# Patient Record
Sex: Female | Born: 1950 | Race: Black or African American | Hispanic: No | Smoking: Current every day smoker
Health system: Southern US, Community
[De-identification: ages and names within clinical notes are randomized; demographics above are authoritative.]

## PROBLEM LIST (undated history)

## (undated) DIAGNOSIS — Z973 Presence of spectacles and contact lenses: Secondary | ICD-10-CM

## (undated) HISTORY — PX: BREAST EXCISIONAL BIOPSY: SUR124

## (undated) HISTORY — PX: LASIK: SHX215

## (undated) HISTORY — PX: COLONOSCOPY: SHX174

---

## 1998-01-20 ENCOUNTER — Other Ambulatory Visit: Admission: RE | Admit: 1998-01-20 | Discharge: 1998-01-20 | Payer: Self-pay | Admitting: Obstetrics and Gynecology

## 1999-02-22 ENCOUNTER — Other Ambulatory Visit: Admission: RE | Admit: 1999-02-22 | Discharge: 1999-02-22 | Payer: Self-pay | Admitting: Obstetrics and Gynecology

## 2000-03-02 ENCOUNTER — Other Ambulatory Visit: Admission: RE | Admit: 2000-03-02 | Discharge: 2000-03-02 | Payer: Self-pay | Admitting: Obstetrics and Gynecology

## 2001-03-06 ENCOUNTER — Other Ambulatory Visit: Admission: RE | Admit: 2001-03-06 | Discharge: 2001-03-06 | Payer: Self-pay | Admitting: Obstetrics and Gynecology

## 2001-12-07 ENCOUNTER — Encounter: Admission: RE | Admit: 2001-12-07 | Discharge: 2001-12-07 | Payer: Self-pay | Admitting: *Deleted

## 2002-03-25 ENCOUNTER — Other Ambulatory Visit: Admission: RE | Admit: 2002-03-25 | Discharge: 2002-03-25 | Payer: Self-pay | Admitting: Obstetrics and Gynecology

## 2003-02-05 ENCOUNTER — Emergency Department (HOSPITAL_COMMUNITY): Admission: EM | Admit: 2003-02-05 | Discharge: 2003-02-05 | Payer: Self-pay | Admitting: Emergency Medicine

## 2003-07-23 ENCOUNTER — Other Ambulatory Visit: Admission: RE | Admit: 2003-07-23 | Discharge: 2003-07-23 | Payer: Self-pay | Admitting: Obstetrics and Gynecology

## 2003-08-04 ENCOUNTER — Encounter: Admission: RE | Admit: 2003-08-04 | Discharge: 2003-08-04 | Payer: Self-pay | Admitting: Obstetrics and Gynecology

## 2004-06-14 ENCOUNTER — Ambulatory Visit (HOSPITAL_COMMUNITY): Admission: RE | Admit: 2004-06-14 | Discharge: 2004-06-14 | Payer: Self-pay | Admitting: Gastroenterology

## 2004-07-28 ENCOUNTER — Other Ambulatory Visit: Admission: RE | Admit: 2004-07-28 | Discharge: 2004-07-28 | Payer: Self-pay | Admitting: Obstetrics and Gynecology

## 2005-08-20 ENCOUNTER — Emergency Department (HOSPITAL_COMMUNITY): Admission: EM | Admit: 2005-08-20 | Discharge: 2005-08-20 | Payer: Self-pay | Admitting: Emergency Medicine

## 2005-10-06 ENCOUNTER — Ambulatory Visit: Payer: Self-pay | Admitting: Gastroenterology

## 2005-10-13 ENCOUNTER — Ambulatory Visit: Payer: Self-pay | Admitting: Gastroenterology

## 2005-10-27 ENCOUNTER — Ambulatory Visit: Payer: Self-pay | Admitting: Gastroenterology

## 2005-11-03 ENCOUNTER — Ambulatory Visit: Payer: Self-pay | Admitting: Gastroenterology

## 2005-11-08 ENCOUNTER — Ambulatory Visit (HOSPITAL_COMMUNITY): Admission: RE | Admit: 2005-11-08 | Discharge: 2005-11-08 | Payer: Self-pay | Admitting: Gastroenterology

## 2005-11-10 ENCOUNTER — Ambulatory Visit: Payer: Self-pay | Admitting: Gastroenterology

## 2005-12-08 ENCOUNTER — Ambulatory Visit: Payer: Self-pay | Admitting: Gastroenterology

## 2005-12-15 ENCOUNTER — Ambulatory Visit: Payer: Self-pay | Admitting: Gastroenterology

## 2005-12-29 ENCOUNTER — Ambulatory Visit: Payer: Self-pay | Admitting: Gastroenterology

## 2006-01-12 ENCOUNTER — Ambulatory Visit: Payer: Self-pay | Admitting: Gastroenterology

## 2006-01-26 ENCOUNTER — Ambulatory Visit: Payer: Self-pay | Admitting: Gastroenterology

## 2006-02-23 ENCOUNTER — Ambulatory Visit: Payer: Self-pay | Admitting: Gastroenterology

## 2006-03-29 ENCOUNTER — Ambulatory Visit: Payer: Self-pay | Admitting: Gastroenterology

## 2006-06-13 ENCOUNTER — Ambulatory Visit: Payer: Self-pay | Admitting: Gastroenterology

## 2006-09-07 ENCOUNTER — Ambulatory Visit: Payer: Self-pay | Admitting: Gastroenterology

## 2009-04-15 ENCOUNTER — Other Ambulatory Visit: Admission: RE | Admit: 2009-04-15 | Discharge: 2009-04-15 | Payer: Self-pay | Admitting: Family Medicine

## 2009-04-22 ENCOUNTER — Encounter: Admission: RE | Admit: 2009-04-22 | Discharge: 2009-04-22 | Payer: Self-pay | Admitting: Family Medicine

## 2010-04-18 ENCOUNTER — Encounter: Payer: Self-pay | Admitting: Gastroenterology

## 2010-04-23 ENCOUNTER — Encounter
Admission: RE | Admit: 2010-04-23 | Discharge: 2010-04-23 | Payer: Self-pay | Source: Home / Self Care | Attending: Obstetrics and Gynecology | Admitting: Obstetrics and Gynecology

## 2010-05-04 ENCOUNTER — Other Ambulatory Visit: Payer: Self-pay | Admitting: Family Medicine

## 2010-05-04 ENCOUNTER — Other Ambulatory Visit (HOSPITAL_COMMUNITY)
Admission: RE | Admit: 2010-05-04 | Discharge: 2010-05-04 | Disposition: A | Discharge status: Home / Self Care | Payer: BC Managed Care – PPO | Source: Ambulatory Visit | Attending: Family Medicine | Admitting: Family Medicine

## 2010-05-04 DIAGNOSIS — Z1159 Encounter for screening for other viral diseases: Secondary | ICD-10-CM | POA: Insufficient documentation

## 2010-05-04 DIAGNOSIS — Z113 Encounter for screening for infections with a predominantly sexual mode of transmission: Secondary | ICD-10-CM | POA: Insufficient documentation

## 2010-05-04 DIAGNOSIS — Z124 Encounter for screening for malignant neoplasm of cervix: Secondary | ICD-10-CM | POA: Insufficient documentation

## 2010-08-13 NOTE — Op Note (Signed)
[name], Christie Velasquez NO.:  [id number]   MEDICAL RECORD NO.:  [id number]          PATIENT TYPE:  AMB   LOCATION:  ENDO                         FACILITY:  [facility]   PHYSICIAN:  Graylin Shiver, M.D.   DATE OF BIRTH:  Oct 01, 1950   DATE OF PROCEDURE:  06/14/2004  DATE OF DISCHARGE:                                 OPERATIVE REPORT   PROCEDURE PERFORMED:  Colonoscopy.   INDICATIONS:  Screening.   Informed consent was obtained after explanation of the risks of bleeding,  infection and perforation.   PREMEDICATION:  Fentanyl 100 mcg IV, Versed 10 milligrams IV.   PROCEDURE:  With the patient in the left lateral decubitus position, a  rectal exam was performed. No masses were felt. The Olympus colonoscope was  inserted into the rectum and advanced around the colon to the cecum. Cecal  landmarks were identified. The cecum and ascending colon were normal. The  transverse colon normal. The descending colon, sigmoid and rectum were  normal. She tolerated the procedure well without complications.   IMPRESSION:  Normal colonoscopy to the cecum.   I would recommend a follow-up screening colonoscopy again in 10 years.      SFG/MEDQ  D:  06/14/2004  T:  06/14/2004  Job:  [id number]   cc:   Juluis Mire, M.D.  [address] [place]  [place] [address]  Fax: [phone]

## 2011-03-30 ENCOUNTER — Other Ambulatory Visit: Payer: Self-pay | Admitting: Obstetrics and Gynecology

## 2011-03-30 DIAGNOSIS — Z1231 Encounter for screening mammogram for malignant neoplasm of breast: Secondary | ICD-10-CM

## 2011-04-25 ENCOUNTER — Ambulatory Visit
Admission: RE | Admit: 2011-04-25 | Discharge: 2011-04-25 | Disposition: A | Discharge status: Home / Self Care | Payer: BC Managed Care – PPO | Source: Ambulatory Visit | Attending: Obstetrics and Gynecology | Admitting: Obstetrics and Gynecology

## 2011-04-25 DIAGNOSIS — Z1231 Encounter for screening mammogram for malignant neoplasm of breast: Secondary | ICD-10-CM

## 2012-03-23 ENCOUNTER — Other Ambulatory Visit: Payer: Self-pay | Admitting: Obstetrics and Gynecology

## 2012-03-23 DIAGNOSIS — Z1231 Encounter for screening mammogram for malignant neoplasm of breast: Secondary | ICD-10-CM

## 2012-04-25 ENCOUNTER — Ambulatory Visit
Admission: RE | Admit: 2012-04-25 | Discharge: 2012-04-25 | Disposition: A | Discharge status: Home / Self Care | Payer: BC Managed Care – PPO | Source: Ambulatory Visit | Attending: Obstetrics and Gynecology | Admitting: Obstetrics and Gynecology

## 2012-04-25 DIAGNOSIS — Z1231 Encounter for screening mammogram for malignant neoplasm of breast: Secondary | ICD-10-CM

## 2013-04-01 ENCOUNTER — Other Ambulatory Visit: Payer: Self-pay

## 2013-04-01 DIAGNOSIS — Z1231 Encounter for screening mammogram for malignant neoplasm of breast: Secondary | ICD-10-CM

## 2013-05-13 ENCOUNTER — Ambulatory Visit: Payer: BC Managed Care – PPO

## 2013-05-13 ENCOUNTER — Ambulatory Visit
Admission: RE | Admit: 2013-05-13 | Discharge: 2013-05-13 | Disposition: A | Discharge status: Home / Self Care | Payer: BC Managed Care – PPO | Source: Ambulatory Visit

## 2013-05-13 DIAGNOSIS — Z1231 Encounter for screening mammogram for malignant neoplasm of breast: Secondary | ICD-10-CM

## 2013-05-27 ENCOUNTER — Other Ambulatory Visit: Payer: Self-pay | Admitting: Family Medicine

## 2013-05-27 ENCOUNTER — Other Ambulatory Visit (HOSPITAL_COMMUNITY)
Admission: RE | Admit: 2013-05-27 | Discharge: 2013-05-27 | Disposition: A | Discharge status: Home / Self Care | Payer: BC Managed Care – PPO | Source: Ambulatory Visit | Attending: Family Medicine | Admitting: Family Medicine

## 2013-05-27 DIAGNOSIS — Z124 Encounter for screening for malignant neoplasm of cervix: Secondary | ICD-10-CM | POA: Insufficient documentation

## 2013-07-16 ENCOUNTER — Other Ambulatory Visit: Payer: Self-pay | Admitting: Orthopedic Surgery

## 2013-07-25 ENCOUNTER — Encounter (HOSPITAL_BASED_OUTPATIENT_CLINIC_OR_DEPARTMENT_OTHER): Payer: Self-pay | Admitting: *Deleted

## 2013-07-25 NOTE — Progress Notes (Signed)
No labs needed

## 2013-07-29 NOTE — H&P (Signed)
  Christie Velasquez is an 63 y.o. female.   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 She previously worked at [employer]. She has a history of stenosing tenosynovitis of her right first dorsal compartment dating back 6 or more months. She has a grandchild that she cannot lift due to pain. She reports discomfort with radial and ulnar deviation of the wrist. She could not recall any antecedent history of injury.     Past Medical History  Diagnosis Date  . Wears glasses     Past Surgical History  Procedure Laterality Date  . Colonoscopy    . Lasik      History reviewed. No pertinent family history. Social History:  reports that she has been smoking.  She does not have any smokeless tobacco history on file. She reports that she drinks alcohol. She reports that she does not use illicit drugs.  Allergies:  Allergies  Allergen Reactions  . Sulfa Antibiotics Hives    No prescriptions prior to admission    No results found for this or any previous visit (from the past 48 hour(s)).  No results found.   Pertinent items are noted in HPI.  Height 5\' 3"  (1.6 m), weight 64.411 kg (142 lb).  General appearance: alert Head: Normocephalic, without obvious abnormality Neck: supple, symmetrical, trachea midline Resp: clear to auscultation bilaterally Cardio: regular rate and rhythm GI: normal findings: bowel sounds normal Extremities:  Inspection of her hands reveals an obvious swelling of her right first dorsal compartment. Her pulses and capillary refill are intact bilaterally. She has full ROM of her fingers and thumb. She has a painful Finkelstein's maneuver on the right. She cannot radially deviate her wrist beyond 15 degrees due to pain and cannot ulnarly deviate more than 20 degrees due to pain. She has no sign of entrapment neuropathy of her median,  radial or ulnar nerves.   X-rays of her wrist AP, lateral and oblique demonstrates normal bony anatomy.   Pulses: 2+ and symmetric Skin: normal Neurologic: Grossly normal    Assessment/Plan Impression: Right wrist DeQuervain's STS  Plan: To the OR for release right first DC.The procedure, risks,benefits and post-op course were discussed with the patient at length and they were in agreement with the plan.   [name] 07/29/2013, 2:42 PM   H&P documentation: 07/30/2013  -History and Physical Reviewed  -Patient has been re-examined  -No change in the plan of care  [name], MD

## 2013-07-30 ENCOUNTER — Encounter (HOSPITAL_BASED_OUTPATIENT_CLINIC_OR_DEPARTMENT_OTHER)
Admission: RE | Disposition: A | Discharge status: Home / Self Care | Payer: Self-pay | Source: Ambulatory Visit | Attending: Orthopedic Surgery

## 2013-07-30 ENCOUNTER — Ambulatory Visit (HOSPITAL_BASED_OUTPATIENT_CLINIC_OR_DEPARTMENT_OTHER): Payer: BC Managed Care – PPO | Admitting: Anesthesiology

## 2013-07-30 ENCOUNTER — Encounter (HOSPITAL_BASED_OUTPATIENT_CLINIC_OR_DEPARTMENT_OTHER): Payer: BC Managed Care – PPO | Admitting: Anesthesiology

## 2013-07-30 ENCOUNTER — Ambulatory Visit (HOSPITAL_BASED_OUTPATIENT_CLINIC_OR_DEPARTMENT_OTHER)
Admission: RE | Admit: 2013-07-30 | Discharge: 2013-07-30 | Disposition: A | Discharge status: Home / Self Care | Payer: BC Managed Care – PPO | Source: Ambulatory Visit | Attending: Orthopedic Surgery | Admitting: Orthopedic Surgery

## 2013-07-30 ENCOUNTER — Encounter (HOSPITAL_BASED_OUTPATIENT_CLINIC_OR_DEPARTMENT_OTHER): Payer: Self-pay | Admitting: Orthopedic Surgery

## 2013-07-30 DIAGNOSIS — Z882 Allergy status to sulfonamides status: Secondary | ICD-10-CM | POA: Insufficient documentation

## 2013-07-30 DIAGNOSIS — M654 Radial styloid tenosynovitis [de Quervain]: Secondary | ICD-10-CM | POA: Insufficient documentation

## 2013-07-30 HISTORY — PX: DORSAL COMPARTMENT RELEASE: SHX5039

## 2013-07-30 HISTORY — DX: Presence of spectacles and contact lenses: Z97.3

## 2013-07-30 LAB — POCT HEMOGLOBIN-HEMACUE: Hemoglobin: 14.2 g/dL (ref 12.0–15.0)

## 2013-07-30 SURGERY — RELEASE, FIRST DORSAL COMPARTMENT, HAND
Anesthesia: Monitor Anesthesia Care | Site: Wrist | Laterality: Right

## 2013-07-30 MED ORDER — ONDANSETRON HCL 4 MG/2ML IJ SOLN: 4.0000 mg | Freq: Once | INTRAMUSCULAR | Status: DC | PRN

## 2013-07-30 MED ORDER — OXYCODONE HCL 5 MG/5ML PO SOLN: 5.0000 mg | Freq: Once | ORAL | Status: AC | PRN

## 2013-07-30 MED ORDER — FENTANYL CITRATE 0.05 MG/ML IJ SOLN
INTRAMUSCULAR | Status: DC | PRN
  Administered 2013-07-30 (×2): 50 ug via INTRAVENOUS

## 2013-07-30 MED ORDER — ACETAMINOPHEN-CODEINE #3 300-30 MG PO TABS
1.0000 | ORAL_TABLET | ORAL | Status: AC | PRN
Start: 2013-07-30 — End: ?

## 2013-07-30 MED ORDER — FENTANYL CITRATE 0.05 MG/ML IJ SOLN
50.0000 ug | INTRAMUSCULAR | Status: DC | PRN
Start: 2013-07-30 — End: 2013-07-30

## 2013-07-30 MED ORDER — HYDROMORPHONE HCL PF 1 MG/ML IJ SOLN: 0.2500 mg | INTRAMUSCULAR | Status: DC | PRN

## 2013-07-30 MED ORDER — LACTATED RINGERS IV SOLN
INTRAVENOUS | Status: DC
  Administered 2013-07-30: 07:00:00 via INTRAVENOUS

## 2013-07-30 MED ORDER — FENTANYL CITRATE 0.05 MG/ML IJ SOLN
INTRAMUSCULAR | Status: AC
  Filled 2013-07-30: qty 4

## 2013-07-30 MED ORDER — OXYCODONE HCL 5 MG PO TABS
5.0000 mg | ORAL_TABLET | Freq: Once | ORAL | Status: AC | PRN
  Administered 2013-07-30: 5 mg via ORAL

## 2013-07-30 MED ORDER — MIDAZOLAM HCL 5 MG/5ML IJ SOLN
INTRAMUSCULAR | Status: DC | PRN
  Administered 2013-07-30: 2 mg via INTRAVENOUS

## 2013-07-30 MED ORDER — OXYCODONE HCL 5 MG PO TABS
ORAL_TABLET | ORAL | Status: AC
  Filled 2013-07-30: qty 1

## 2013-07-30 MED ORDER — MIDAZOLAM HCL 2 MG/2ML IJ SOLN: 1.0000 mg | INTRAMUSCULAR | Status: DC | PRN

## 2013-07-30 MED ORDER — LIDOCAINE HCL 2 % IJ SOLN
INTRAMUSCULAR | Status: DC | PRN
  Administered 2013-07-30: 2 mL

## 2013-07-30 MED ORDER — CHLORHEXIDINE GLUCONATE 4 % EX LIQD: 60.0000 mL | Freq: Once | CUTANEOUS | Status: DC

## 2013-07-30 MED ORDER — LIDOCAINE HCL 2 % IJ SOLN
INTRAMUSCULAR | Status: AC
  Filled 2013-07-30: qty 20

## 2013-07-30 MED ORDER — PROPOFOL 10 MG/ML IV BOLUS
INTRAVENOUS | Status: DC | PRN
  Administered 2013-07-30: 25 mg via INTRAVENOUS
  Administered 2013-07-30: 50 mg via INTRAVENOUS

## 2013-07-30 MED ORDER — METHYLPREDNISOLONE ACETATE 40 MG/ML IJ SUSP
INTRAMUSCULAR | Status: AC
  Filled 2013-07-30: qty 1

## 2013-07-30 MED ORDER — ONDANSETRON HCL 4 MG/2ML IJ SOLN
INTRAMUSCULAR | Status: DC | PRN
  Administered 2013-07-30: 4 mg via INTRAVENOUS

## 2013-07-30 MED ORDER — MIDAZOLAM HCL 2 MG/2ML IJ SOLN
INTRAMUSCULAR | Status: AC
  Filled 2013-07-30: qty 2

## 2013-07-30 SURGICAL SUPPLY — 46 items
BANDAGE COBAN STERILE 2 (GAUZE/BANDAGES/DRESSINGS) ×3 IMPLANT
BANDAGE ELASTIC 3 VELCRO ST LF (GAUZE/BANDAGES/DRESSINGS) ×3 IMPLANT
BLADE 15 SAFETY STRL DISP (BLADE) IMPLANT
BLADE MINI RND TIP GREEN BEAV (BLADE) IMPLANT
BLADE SURG 15 STRL LF DISP TIS (BLADE) ×1 IMPLANT
BLADE SURG 15 STRL SS (BLADE) ×2
BNDG ESMARK 4X9 LF (GAUZE/BANDAGES/DRESSINGS) ×3 IMPLANT
BRUSH SCRUB EZ PLAIN DRY (MISCELLANEOUS) ×3 IMPLANT
CLOSURE WOUND 1/2 X4 (GAUZE/BANDAGES/DRESSINGS) ×1
CORDS BIPOLAR (ELECTRODE) ×3 IMPLANT
COVER MAYO STAND STRL (DRAPES) ×3 IMPLANT
COVER TABLE BACK 60X90 (DRAPES) ×3 IMPLANT
CUFF TOURNIQUET SINGLE 18IN (TOURNIQUET CUFF) ×3 IMPLANT
DECANTER SPIKE VIAL GLASS SM (MISCELLANEOUS) IMPLANT
DRAPE EXTREMITY T 121X128X90 (DRAPE) ×3 IMPLANT
DRAPE SURG 17X23 STRL (DRAPES) ×3 IMPLANT
DRSG TEGADERM 4X4.75 (GAUZE/BANDAGES/DRESSINGS) ×3 IMPLANT
GAUZE SPONGE 4X4 12PLY STRL (GAUZE/BANDAGES/DRESSINGS) ×3 IMPLANT
GLOVE BIO SURGEON STRL SZ 6.5 (GLOVE) ×2 IMPLANT
GLOVE BIO SURGEONS STRL SZ 6.5 (GLOVE) ×1
GLOVE BIOGEL M STRL SZ7.5 (GLOVE) ×3 IMPLANT
GLOVE BIOGEL PI IND STRL 7.0 (GLOVE) ×1 IMPLANT
GLOVE BIOGEL PI INDICATOR 7.0 (GLOVE) ×2
GLOVE EXAM NITRILE MD LF STRL (GLOVE) ×3 IMPLANT
GLOVE ORTHO TXT STRL SZ7.5 (GLOVE) ×3 IMPLANT
GOWN STRL REUS W/ TWL LRG LVL3 (GOWN DISPOSABLE) ×1 IMPLANT
GOWN STRL REUS W/ TWL XL LVL3 (GOWN DISPOSABLE) ×1 IMPLANT
GOWN STRL REUS W/TWL LRG LVL3 (GOWN DISPOSABLE) ×2
GOWN STRL REUS W/TWL XL LVL3 (GOWN DISPOSABLE) ×2
NEEDLE 27GAX1X1/2 (NEEDLE) ×3 IMPLANT
PACK BASIN DAY SURGERY FS (CUSTOM PROCEDURE TRAY) ×3 IMPLANT
PAD CAST 3X4 CTTN HI CHSV (CAST SUPPLIES) IMPLANT
PADDING CAST ABS 4INX4YD NS (CAST SUPPLIES) ×2
PADDING CAST ABS COTTON 4X4 ST (CAST SUPPLIES) ×1 IMPLANT
PADDING CAST COTTON 3X4 STRL (CAST SUPPLIES)
SLEEVE SCD COMPRESS KNEE MED (MISCELLANEOUS) IMPLANT
STOCKINETTE 4X48 STRL (DRAPES) ×3 IMPLANT
STRIP CLOSURE SKIN 1/2X4 (GAUZE/BANDAGES/DRESSINGS) ×2 IMPLANT
SUT PROLENE 3 0 PS 2 (SUTURE) ×3 IMPLANT
SUT PROLENE 4 0 P 3 18 (SUTURE) IMPLANT
SUT VIC AB 4-0 P-3 18XBRD (SUTURE) IMPLANT
SUT VIC AB 4-0 P3 18 (SUTURE)
SYR 3ML 23GX1 SAFETY (SYRINGE) IMPLANT
SYR CONTROL 10ML LL (SYRINGE) ×3 IMPLANT
TRAY DSU PREP LF (CUSTOM PROCEDURE TRAY) ×3 IMPLANT
UNDERPAD 30X30 INCONTINENT (UNDERPADS AND DIAPERS) ×3 IMPLANT

## 2013-07-30 NOTE — Transfer of Care (Signed)
Immediate Anesthesia Transfer of Care Note  Patient: Christie Velasquez  Procedure(s) Performed: Procedure(s): RELEASE RIGHT FIRST  DORSAL COMPARTMENT (DEQUERVAIN) (Right)  Patient Location: PACU  Anesthesia Type:MAC  Level of Consciousness: awake, alert , oriented and patient cooperative  Airway & Oxygen Therapy: Patient Spontanous Breathing and Patient connected to face mask oxygen  Post-op Assessment: Report given to PACU RN and Post -op Vital signs reviewed and stable  Post vital signs: Reviewed and stable  Complications: No apparent anesthesia complications

## 2013-07-30 NOTE — Discharge Instructions (Addendum)

## 2013-07-30 NOTE — Op Note (Signed)
[id number] 

## 2013-07-30 NOTE — Op Note (Signed)
NAMETIONNE, CARELLI NO.:  [id number]  MEDICAL RECORD NO.:  [id number]  LOCATION:                                 FACILITY:  PHYSICIAN:  Youlanda Mighty. Alastair Hennes, M.D. 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  OPERATION:  Release of right first dorsal compartment.  Resection of septum between abductor pollicis longus and extensor pollicis brevis tendons.  OPERATING SURGEON:  Youlanda Mighty. Shiann Kam, MD  ASSISTANT:  Registered nurse.  ANESTHESIA:  A 2% lidocaine field block and first dorsal compartment block supplemented by IV sedation.  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  She had a swelling at the first dorsal compartment, pain with ulnar deviation of her wrist, and a very painful Finkelstein maneuver.  She has failed splinting, activity modification, and anti-inflammatory medication.  Due to the significant wall thickness increase, she had been advised proceeding with release of the first dorsal compartment under local anesthesia and sedation.  After informed consent, she is brought to the operating room at this time.  Preoperatively, she was interviewed by Dr. Linna Caprice from Anesthesia. Monitored anesthesia care was recommended and accepted by Ms. Belenda Cruise.  Preoperatively, she was examined in the holding area.  A proper surgical site identified as protocol with marking pen.  PROCEDURE:  Keeya Dyckman is brought to room 2 of the [facility] and placed in supine position on the operating table.  Following IV sedation under Dr. Verneda Skill direct supervision, the right hand and arm were prepped with Betadine followed by infiltration of 2% lidocaine into the path of the intended incision and around the first dorsal compartment and into the first dorsal compartment of the right wrist.  After 5 minutes, excellent anesthesia was achieved.  The right hand and arm were prepped with Betadine soap and solution and sterilely draped. A pneumatic tourniquet was applied to the proximal right brachium.  Following exsanguination of the right arm with Esmarch bandage, arterial tourniquet was inflated to 220 mmHg.  Following routine surgical time-out, a 1.5-cm incision was fashioned transversely directly over the thickened compartment.  Subcutaneous tissues were notable for significant edema.  These were gently released in the first dorsal compartment with scissors dissection and use of a Freer.  Ragnell retractors were placed followed by incision of the compartment at its dorsal apex.  There were 2 slips of the abductor pollicis longus and a single slip of the extensor pollicis brevis. Septum was noted separating the extensor pollicis brevis dorsally.  This was resected with scissors and rongeur.  Thereafter, free range of motion of the thumb and wrist was recovered.  The wound was repaired with intradermal 3-0 Prolene suture and Steri-Strips.  Ms. Madruga was placed in compressive dressing with sterile  gauze, Tegaderm and Coban.  There were no operative complications.  For aftercare, she was provided a prescription for Tylenol with Codeine #3, 1 or 2 tablets p.o. q.4-6 hours p.r.n. pain, 20 tabs without refill.     Youlanda Mighty Autry Droege, M.D.     RVS/MEDQ  D:  07/30/2013  T:  07/30/2013  Job:  [id number]

## 2013-07-30 NOTE — Brief Op Note (Signed)
07/30/2013  8:02 AM  PATIENT:  Christie Velasquez  63 y.o. female  PRE-OPERATIVE DIAGNOSIS:  SEVERE RIGHT DEQUERVAIN'S 1ST DORSAL COMPARTMENT  POST-OPERATIVE DIAGNOSIS:  SEVERE RIGHT DEQUERVAIN'S 1ST DORSAL COMPARTMENT  PROCEDURE:  Procedure(s): RELEASE RIGHT FIRST  DORSAL COMPARTMENT (DEQUERVAIN) (Right)  SURGEON:  Surgeon(s) and Role:    * [name]., MD - Primary  PHYSICIAN ASSISTANT:   ASSISTANTS: nurse  ANESTHESIA:   MAC  EBL:  Total I/O In: 500 [I.V.:500] Out: -   BLOOD ADMINISTERED:none  DRAINS: none   LOCAL MEDICATIONS USED:  XYLOCAINE   SPECIMEN:  No Specimen  DISPOSITION OF SPECIMEN:  N/A  COUNTS:  YES  TOURNIQUET:   Total Tourniquet Time Documented: Upper Arm (Right) - 10 minutes Total: Upper Arm (Right) - 10 minutes   DICTATION: .Other Dictation: Dictation Number [id number]  PLAN OF CARE: Discharge to home after PACU  PATIENT DISPOSITION:  PACU - hemodynamically stable.   Delay start of Pharmacological VTE agent (>24hrs) due to surgical blood loss or risk of bleeding: not applicable

## 2013-07-30 NOTE — Anesthesia Postprocedure Evaluation (Signed)
  Anesthesia Post-op Note  Patient: Christie Velasquez  Procedure(s) Performed: Procedure(s): RELEASE RIGHT FIRST  DORSAL COMPARTMENT (DEQUERVAIN) (Right)  Patient Location: PACU  Anesthesia Type:MAC  Level of Consciousness: awake, alert  and oriented  Airway and Oxygen Therapy: Patient Spontanous Breathing  Post-op Pain: mild  Post-op Assessment: Post-op Vital signs reviewed, Patient's Cardiovascular Status Stable, Respiratory Function Stable, Patent Airway and Pain level controlled  Post-op Vital Signs: stable  Last Vitals:  Filed Vitals:   07/30/13 0843  BP: 140/68  Pulse: 62  Temp: 36.4 C  Resp: 18    Complications: No apparent anesthesia complications

## 2013-07-30 NOTE — Anesthesia Preprocedure Evaluation (Signed)
Anesthesia Evaluation  Patient identified by MRN, date of birth, ID band Patient awake    Reviewed: Allergy & Precautions, H&P , NPO status   Airway Mallampati: II TM Distance: >3 FB Neck ROM: Full    Dental  (+) Teeth Intact   Pulmonary Current Smoker,  breath sounds clear to auscultation        Cardiovascular Rhythm:Regular Rate:Normal     Neuro/Psych    GI/Hepatic   Endo/Other    Renal/GU      Musculoskeletal   Abdominal   Peds  Hematology   Anesthesia Other Findings   Reproductive/Obstetrics                           Anesthesia Physical Anesthesia Plan  ASA: II  Anesthesia Plan: MAC   Post-op Pain Management:    Induction: Intravenous  Airway Management Planned: Simple Face Mask and Natural Airway  Additional Equipment:   Intra-op Plan:   Post-operative Plan:   Informed Consent: I have reviewed the patients History and Physical, chart, labs and discussed the procedure including the risks, benefits and alternatives for the proposed anesthesia with the patient or authorized representative who has indicated his/her understanding and acceptance.     Plan Discussed with: CRNA and Anesthesiologist  Anesthesia Plan Comments: (Smoker  Plan MAC per patient request  Roberts Gaudy)        Anesthesia Quick Evaluation

## 2013-07-31 ENCOUNTER — Encounter (HOSPITAL_BASED_OUTPATIENT_CLINIC_OR_DEPARTMENT_OTHER): Payer: Self-pay | Admitting: Orthopedic Surgery

## 2013-07-31 NOTE — Addendum Note (Signed)
Addendum created 07/31/13 0849 by Tawni Millers, CRNA   Modules edited: Charges VN

## 2013-08-28 ENCOUNTER — Other Ambulatory Visit: Payer: Self-pay | Admitting: Nurse Practitioner

## 2013-08-28 DIAGNOSIS — C220 Liver cell carcinoma: Secondary | ICD-10-CM

## 2013-09-26 ENCOUNTER — Ambulatory Visit
Admission: RE | Admit: 2013-09-26 | Discharge: 2013-09-26 | Disposition: A | Discharge status: Home / Self Care | Payer: BC Managed Care – PPO | Source: Ambulatory Visit | Attending: Nurse Practitioner | Admitting: Nurse Practitioner

## 2013-09-26 DIAGNOSIS — C220 Liver cell carcinoma: Secondary | ICD-10-CM

## 2014-04-24 ENCOUNTER — Other Ambulatory Visit: Payer: Self-pay

## 2014-04-24 DIAGNOSIS — Z1231 Encounter for screening mammogram for malignant neoplasm of breast: Secondary | ICD-10-CM

## 2014-05-15 ENCOUNTER — Ambulatory Visit: Payer: BC Managed Care – PPO

## 2014-08-05 ENCOUNTER — Ambulatory Visit
Admission: RE | Admit: 2014-08-05 | Discharge: 2014-08-05 | Disposition: A | Discharge status: Home / Self Care | Payer: BC Managed Care – PPO | Source: Ambulatory Visit

## 2014-08-05 DIAGNOSIS — Z1231 Encounter for screening mammogram for malignant neoplasm of breast: Secondary | ICD-10-CM

## 2015-07-01 ENCOUNTER — Other Ambulatory Visit: Payer: Self-pay

## 2015-07-01 DIAGNOSIS — Z1231 Encounter for screening mammogram for malignant neoplasm of breast: Secondary | ICD-10-CM

## 2015-08-10 ENCOUNTER — Ambulatory Visit
Admission: RE | Admit: 2015-08-10 | Discharge: 2015-08-10 | Disposition: A | Discharge status: Home / Self Care | Payer: BC Managed Care – PPO | Source: Ambulatory Visit

## 2015-08-10 DIAGNOSIS — Z1231 Encounter for screening mammogram for malignant neoplasm of breast: Secondary | ICD-10-CM

## 2016-07-05 ENCOUNTER — Other Ambulatory Visit: Payer: Self-pay | Admitting: Cardiovascular Disease

## 2016-07-05 ENCOUNTER — Other Ambulatory Visit: Payer: Self-pay | Admitting: Family Medicine

## 2016-07-05 DIAGNOSIS — Z1231 Encounter for screening mammogram for malignant neoplasm of breast: Secondary | ICD-10-CM

## 2016-07-19 ENCOUNTER — Other Ambulatory Visit: Payer: Self-pay | Admitting: Family Medicine

## 2016-07-19 ENCOUNTER — Other Ambulatory Visit (HOSPITAL_COMMUNITY)
Admission: RE | Admit: 2016-07-19 | Discharge: 2016-07-19 | Disposition: A | Discharge status: Home / Self Care | Payer: BC Managed Care – PPO | Source: Ambulatory Visit | Attending: Family Medicine | Admitting: Family Medicine

## 2016-07-19 DIAGNOSIS — Z124 Encounter for screening for malignant neoplasm of cervix: Secondary | ICD-10-CM | POA: Diagnosis present

## 2016-07-21 LAB — CYTOLOGY - PAP
Chlamydia: NEGATIVE
Diagnosis: NEGATIVE
Neisseria Gonorrhea: NEGATIVE

## 2016-08-15 ENCOUNTER — Ambulatory Visit
Admission: RE | Admit: 2016-08-15 | Discharge: 2016-08-15 | Disposition: A | Discharge status: Home / Self Care | Payer: BC Managed Care – PPO | Source: Ambulatory Visit | Attending: Cardiovascular Disease | Admitting: Cardiovascular Disease

## 2016-08-15 DIAGNOSIS — Z1231 Encounter for screening mammogram for malignant neoplasm of breast: Secondary | ICD-10-CM

## 2017-01-22 ENCOUNTER — Encounter (HOSPITAL_COMMUNITY): Payer: Self-pay | Admitting: *Deleted

## 2017-01-22 ENCOUNTER — Emergency Department (HOSPITAL_COMMUNITY): Payer: BC Managed Care – PPO

## 2017-01-22 ENCOUNTER — Emergency Department (HOSPITAL_COMMUNITY)
Admission: EM | Admit: 2017-01-22 | Discharge: 2017-01-22 | Disposition: A | Discharge status: Home / Self Care | Payer: BC Managed Care – PPO | Attending: Emergency Medicine | Admitting: Emergency Medicine

## 2017-01-22 DIAGNOSIS — F17200 Nicotine dependence, unspecified, uncomplicated: Secondary | ICD-10-CM | POA: Diagnosis not present

## 2017-01-22 DIAGNOSIS — M25511 Pain in right shoulder: Secondary | ICD-10-CM | POA: Insufficient documentation

## 2017-01-22 MED ORDER — CYCLOBENZAPRINE HCL 10 MG PO TABS: 10.0000 mg | ORAL_TABLET | Freq: Three times a day (TID) | ORAL | 0 refills | Status: AC | PRN

## 2017-01-22 MED ORDER — LIDOCAINE 5 % EX PTCH
1.0000 | MEDICATED_PATCH | CUTANEOUS | Status: DC
Start: 2017-01-22 — End: 2017-01-22
  Administered 2017-01-22: 1 via TRANSDERMAL
  Filled 2017-01-22: qty 1

## 2017-01-22 MED ORDER — NAPROXEN 375 MG PO TABS: 375.0000 mg | ORAL_TABLET | Freq: Two times a day (BID) | ORAL | 0 refills | Status: AC

## 2017-01-22 MED ORDER — NAPROXEN 250 MG PO TABS
250.0000 mg | ORAL_TABLET | Freq: Once | ORAL | Status: AC
  Administered 2017-01-22: 250 mg via ORAL
  Filled 2017-01-22: qty 1

## 2017-01-22 MED ORDER — LIDOCAINE 5 % EX PTCH: 1.0000 | MEDICATED_PATCH | CUTANEOUS | 0 refills | Status: AC

## 2017-01-22 NOTE — ED Provider Notes (Signed)
[place] EMERGENCY DEPARTMENT Provider Note   CSN: [id number] Arrival date & time: 01/22/17  5784     History   Chief Complaint Chief Complaint  Patient presents with  . Shoulder Pain    HPI Christie Velasquez is a 66 y.o. female.  HPI 66 year old African-American female with no pertinent past medical history presents to the emergency department today with complaints of right shoulder pain.  Patient states that she has been having right shoulder pain since 5 days ago.  The patient states that the pain is worse with range of motion.  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  Denies any significant trauma to the shoulder.  Patient states that she has limited range of motion to the shoulder due to the pain.  It is difficult to raise her right shoulder over her head.  Also states that it is difficult to lift up her arm due to the pain in her shoulder.  States that it is point tenderness to palpation.  Patient has been taking Tylenol without relief.   she is also been trying icy hot over-the-counter with little relief.  Patient denies any associated chest pain, shortness of breath, palpitations, fatigue, weakness.  Denies any associated paresthesias or weakness. Past Medical History:  Diagnosis Date  . Wears glasses     There are no active problems to display for this patient.   Past Surgical History:  Procedure Laterality Date  . BREAST EXCISIONAL BIOPSY    . CESAREAN SECTION    . COLONOSCOPY    . DORSAL COMPARTMENT RELEASE Right 07/30/2013   Procedure: RELEASE RIGHT FIRST  DORSAL COMPARTMENT (DEQUERVAIN);  Surgeon: [name]., MD;  [facility];  Service: Orthopedics;  Laterality: Right;  . LASIK      OB History    No data available       Home Medications    Prior to Admission medications   Medication Sig Start Date End Date Taking? Authorizing Provider    acetaminophen-codeine (TYLENOL #3) 300-30 MG per tablet Take 1-2 tablets by mouth every 4 (four) hours as needed for moderate pain. 07/30/13   Sypher, Herbie Baltimore, MD  Multiple Vitamins-Minerals (MULTIVITAMIN WITH MINERALS) tablet Take 1 tablet by mouth daily.    [provider]    Family History Family History  Problem Relation Age of Onset  . Breast cancer Sister     Social History Social History  Substance Use Topics  . Smoking status: Current Every Day Smoker    Packs/day: 0.50  . Smokeless tobacco: Never Used  . Alcohol use Yes     Comment: occ     Allergies   Sulfa antibiotics   Review of Systems Review of Systems  Constitutional: Negative for fatigue.  Respiratory: Negative for shortness of breath.   Cardiovascular: Negative for chest pain.  Musculoskeletal: Positive for arthralgias and myalgias. Negative for neck pain and neck stiffness.  Skin: Negative for color change.  Neurological: Negative for weakness and numbness.     Physical Exam Updated Vital Signs BP (!) 151/81 (BP Location: Left Arm)   Pulse 69   Temp 98.1 F (36.7 C) (Oral)   Resp 14   Ht 5\' 3"  (1.6 m)   Wt 67.1 kg (148 lb)   SpO2 100%   BMI 26.22 kg/m   Physical Exam  Constitutional: She appears well-developed and well-nourished. No distress.  HENT:  Head:  Normocephalic and atraumatic.  Eyes: Right eye exhibits no discharge. Left eye exhibits no discharge. No scleral icterus.  Neck: Normal range of motion.  Pulmonary/Chest: No respiratory distress.  Musculoskeletal: Normal range of motion.       Arms: Point tenderness over the head of the right humerus.  Patient with limited range of motion due to the pain.  Patient unable to fully raise her right arm over her head.  Positive Hawkins and Neer sign.  Grip strength is normal.  Patient has tense musculature noted of the right paraspinal musculature that radiate to the right trapezius.  No obvious deformity, edema, ecchymosis, erythema,  warmth.  Radial pulses 2+ bilaterally.  Sensation intact.  Cap refill normal.  Axillary nerve intact.  Neurological: She is alert.  Skin: Skin is warm and dry. Capillary refill takes less than 2 seconds. No pallor.  Psychiatric: Her behavior is normal. Judgment and thought content normal.  Nursing note and vitals reviewed.    ED Treatments / Results  Labs (all labs ordered are listed, but only abnormal results are displayed) Labs Reviewed - No data to display  EKG  EKG Interpretation None       Radiology Dg Shoulder Right  Result Date: 01/22/2017 CLINICAL DATA:  Pt c/o right shoulder pain with movement x 5 days. No injury to the area. No hx of prior injuries or surgeries to the area. EXAM: RIGHT SHOULDER - 2+ VIEW COMPARISON:  None. FINDINGS: Osseous alignment is normal. No fracture line or displaced fracture fragment seen. No acute or suspicious osseous lesion. Calcifications are seen at the superior-lateral margin of the right humeral head suggesting chronic calcific tendinopathy. No evidence of degenerative change at the glenohumeral or acromioclavicular joint spaces. IMPRESSION: 1. No acute findings. 2. 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 Electronically Signed   By: Franki Cabot M.D.   On: 01/22/2017 08:45    Procedures Procedures (including critical care time)  Medications Ordered in ED Medications  lidocaine (LIDODERM) 5 % 1 patch (not administered)  naproxen (NAPROSYN) tablet 250 mg (not administered)     Initial Impression / Assessment and Plan / ED Course  I have reviewed the triage vital signs and the nursing notes.  Pertinent labs & imaging results that were available during my care of the patient were reviewed by me and considered in my medical decision making (see chart for details).     Patient presents to the ED with complaints of right  shoulder pain that is worse with range of motion.  No known injury the patient does use her shoulder frequently with repetitive motions.  Patient has point tenderness.  There is no overlying signs of erythema or warmth to be concerning for septic arthritis.  Patient denies any associated chest pain, shortness of breath, fatigue, weakness or be concerning for ACS.  X-ray reveals calcifications of the overlying the superior lateral margins of the right humeral head compatible with chronic calcification tendinopathy of the rotator cuff insertion.  Patient symptoms seem more consistent with rotator cuff pathology.  She is neurovascularly intact.  Will give sling.  Discussed symptomatic treatment at home.  Orth O follow-up with range of motion exercises.  Pt is hemodynamically stable, in NAD, & able to ambulate in the ED. Evaluation does not show pathology that would require ongoing emergent intervention or inpatient treatment. I explained the diagnosis to the patient. Pain has been managed & has no  complaints prior to dc. Pt is comfortable with above plan and is stable for discharge at this time. All questions were answered prior to disposition. Strict return precautions for f/u to the ED were discussed. Encouraged follow up with PCP.   Final Clinical Impressions(s) / ED Diagnoses   Final diagnoses:  Acute pain of right shoulder    New Prescriptions New Prescriptions   No medications on file     Aaron Edelman 01/22/17 8469    Gareth Morgan, MD 01/26/17 1141

## 2017-01-22 NOTE — ED Triage Notes (Addendum)
Pt has been having R shoulder pain since Wednesday. 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 Pt has limited ROM to shoulder. denies chest pain. Pt took two 500mg  tylenols without relief

## 2017-01-22 NOTE — Discharge Instructions (Signed)
X-ray shows signs of rotator cuff pathology.  Encourage you to apply the lidocaine patches.   Please take the Naproxen as prescribed for pain. Do not take any additional NSAIDs including Motrin, Aleve, Ibuprofen, Advil.  Please the the flexeril for muscle relaxation. This medication will make you drowsy so avoid situation that could place you in danger.   Have given you shoulder range of motion exercises.  Wear these sling for comfort however I would recommend moving your arm every few hours.  Warm and cold compresses.  Follow-up with orthopedics if symptoms are not improving.

## 2017-01-22 NOTE — ED Notes (Signed)
Pt returns from radiology. 

## 2017-01-23 ENCOUNTER — Ambulatory Visit (INDEPENDENT_AMBULATORY_CARE_PROVIDER_SITE_OTHER): Payer: BC Managed Care – PPO | Admitting: Orthopaedic Surgery

## 2017-01-23 DIAGNOSIS — M25511 Pain in right shoulder: Secondary | ICD-10-CM

## 2017-01-23 DIAGNOSIS — M7531 Calcific tendinitis of right shoulder: Secondary | ICD-10-CM

## 2017-01-23 MED ORDER — METHYLPREDNISOLONE ACETATE 40 MG/ML IJ SUSP
40.0000 mg | INTRAMUSCULAR | Status: AC | PRN
  Administered 2017-01-23: 40 mg via INTRA_ARTICULAR

## 2017-01-23 MED ORDER — LIDOCAINE HCL 1 % IJ SOLN
3.0000 mL | INTRAMUSCULAR | Status: AC | PRN
  Administered 2017-01-23: 3 mL

## 2017-01-23 NOTE — Progress Notes (Signed)
Office Visit Note   Patient: Christie Velasquez           Date of Birth: December 29, 1950           MRN: [id number] Visit Date: 01/23/2017              Requested by: Lawerance Cruel, [place], [place] [id number] PCP: Lawerance Cruel, MD   Assessment & Plan: Visit Diagnoses:  1. Acute pain of right shoulder   2. Calcific tendonitis of right shoulder     Plan: I do feel this is more of a calcific tendinitis of the right shoulder and I do feel that this is affecting her with activities daily living and overhead activities.  Recommend a steroid injection in the subacromial space and she is agreeable to this.  I explained the risk and benefits of the injections well.  She tolerated the injection well.  She will continue naproxen twice a day and occasional Tylenol and lidocaine as well.  We will see her back in 2 weeks if this is L.  Follow-Up Instructions: Return in about 2 weeks (around 02/06/2017).   Orders:  Orders Placed This Encounter  Procedures  . Large Joint Injection/Arthrocentesis   No orders of the defined types were placed in this encounter.     Procedures: Large Joint Inj Date/Time: 01/23/2017 1:00 PM Performed by: Mcarthur Rossetti Authorized by: Mcarthur Rossetti   Location:  Shoulder Site:  R subacromial bursa Ultrasound Guidance: No   Fluoroscopic Guidance: No   Arthrogram: No   Medications:  3 mL lidocaine 1 %; 40 mg methylPREDNISolone acetate 40 MG/ML     Clinical Data: No additional findings.   Subjective: No chief complaint on file. 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  It started about Wednesday of last week with no known injury but she does do a lot of repetitive activities.  States she went to the emergency room yesterday out of concern this may be a heart issue but she did not really think of the heart as she was shown to have things checked out.  They did obtain x-rays of her  shoulder showing calcific tendinitis.  I have there is only canopy system for me to review.  She says her pain is all around the shoulder.  They gave her some naproxen and she has been placing a lidocaine patch on her shoulder.  She denies any numbness and tingling in her right hand.  She denies any neck pain.  Is only around the shoulder as a source for pain.  It is detrimentally affecting her activities daily living such as clothing herself and washing her hair.  Those type of activities make things for the worse with reaching behind and overhead.  HPI  Review of Systems She currently denies any headache, chest pain, short of breath, fever, chills, nausea, vomiting.  Objective: Vital Signs: There were no vitals taken for this visit.  Physical Exam She is alert and oriented x3 and in no acute distress Ortho Exam Examination of her right shoulder shows pain with abduction and overhead activities.  Internal rotation with adduction is also painful to her.  The rotator cuff itself is difficult to assess due to the pain she is having in her shoulder.  I could not get a good sense of the rotator cuff being weak. Specialty Comments:  No specialty comments available.  Imaging: No results found. X-rays in the canopy system independently reviewed  by me show 3 views of his right shoulder.  There is evidence of calcific tendinitis around the insertion of the rotator cuff.  The shoulder is well located otherwise and there is no acute findings of calcifications around the rotator cuff insertion.  PMFS History: Patient Active Problem List   Diagnosis Date Noted  . Calcific tendonitis of right shoulder 01/23/2017  . Acute pain of right shoulder 01/23/2017   Past Medical History:  Diagnosis Date  . Wears glasses     Family History  Problem Relation Age of Onset  . Breast cancer Sister     Past Surgical History:  Procedure Laterality Date  . BREAST EXCISIONAL BIOPSY    . CESAREAN SECTION    .  COLONOSCOPY    . DORSAL COMPARTMENT RELEASE Right 07/30/2013   Procedure: RELEASE RIGHT FIRST  DORSAL COMPARTMENT (DEQUERVAIN);  Surgeon: [name]., MD;  Location: [facility];  Service: Orthopedics;  Laterality: Right;  . LASIK     Social History   Occupational History  . Not on file.   Social History Main Topics  . Smoking status: Current Every Day Smoker    Packs/day: 0.50  . Smokeless tobacco: Never Used  . Alcohol use Yes     Comment: occ  . Drug use: No  . Sexual activity: Not on file

## 2017-07-11 ENCOUNTER — Other Ambulatory Visit: Payer: Self-pay | Admitting: Family Medicine

## 2017-07-11 DIAGNOSIS — Z139 Encounter for screening, unspecified: Secondary | ICD-10-CM

## 2017-08-15 ENCOUNTER — Ambulatory Visit
Admission: RE | Admit: 2017-08-15 | Discharge: 2017-08-15 | Disposition: A | Discharge status: Home / Self Care | Payer: BC Managed Care – PPO | Source: Ambulatory Visit | Attending: Family Medicine | Admitting: Family Medicine

## 2017-08-15 DIAGNOSIS — Z139 Encounter for screening, unspecified: Secondary | ICD-10-CM

## 2018-09-13 ENCOUNTER — Other Ambulatory Visit: Payer: Self-pay | Admitting: Family Medicine

## 2018-09-13 DIAGNOSIS — Z1231 Encounter for screening mammogram for malignant neoplasm of breast: Secondary | ICD-10-CM

## 2018-10-26 ENCOUNTER — Ambulatory Visit
Admission: RE | Admit: 2018-10-26 | Discharge: 2018-10-26 | Disposition: A | Discharge status: Home / Self Care | Payer: Medicare Other | Source: Ambulatory Visit | Attending: Family Medicine | Admitting: Family Medicine

## 2018-10-26 ENCOUNTER — Other Ambulatory Visit: Payer: Self-pay

## 2018-10-26 DIAGNOSIS — Z1231 Encounter for screening mammogram for malignant neoplasm of breast: Secondary | ICD-10-CM

## 2018-12-31 ENCOUNTER — Other Ambulatory Visit: Payer: Self-pay

## 2018-12-31 DIAGNOSIS — Z20822 Contact with and (suspected) exposure to covid-19: Secondary | ICD-10-CM

## 2019-01-02 LAB — NOVEL CORONAVIRUS, NAA: SARS-CoV-2, NAA: NOT DETECTED

## 2019-03-28 ENCOUNTER — Other Ambulatory Visit: Payer: Self-pay

## 2019-03-28 DIAGNOSIS — Z20822 Contact with and (suspected) exposure to covid-19: Secondary | ICD-10-CM

## 2019-03-29 LAB — NOVEL CORONAVIRUS, NAA: SARS-CoV-2, NAA: NOT DETECTED

## 2019-10-01 ENCOUNTER — Other Ambulatory Visit: Payer: Self-pay | Admitting: Family Medicine

## 2019-10-01 DIAGNOSIS — Z1231 Encounter for screening mammogram for malignant neoplasm of breast: Secondary | ICD-10-CM

## 2019-10-29 ENCOUNTER — Other Ambulatory Visit: Payer: Self-pay

## 2019-10-29 ENCOUNTER — Ambulatory Visit
Admission: RE | Admit: 2019-10-29 | Discharge: 2019-10-29 | Disposition: A | Discharge status: Home / Self Care | Payer: Medicare Other | Source: Ambulatory Visit | Attending: Family Medicine | Admitting: Family Medicine

## 2019-10-29 DIAGNOSIS — Z1231 Encounter for screening mammogram for malignant neoplasm of breast: Secondary | ICD-10-CM

## 2020-09-25 ENCOUNTER — Other Ambulatory Visit: Payer: Self-pay | Admitting: Family Medicine

## 2020-09-25 DIAGNOSIS — Z1231 Encounter for screening mammogram for malignant neoplasm of breast: Secondary | ICD-10-CM

## 2020-11-18 ENCOUNTER — Ambulatory Visit
Admission: RE | Admit: 2020-11-18 | Discharge: 2020-11-18 | Disposition: A | Discharge status: Home / Self Care | Payer: Medicare Other | Source: Ambulatory Visit | Attending: Family Medicine | Admitting: Family Medicine

## 2020-11-18 ENCOUNTER — Other Ambulatory Visit: Payer: Self-pay

## 2020-11-18 DIAGNOSIS — Z1231 Encounter for screening mammogram for malignant neoplasm of breast: Secondary | ICD-10-CM

## 2021-03-15 ENCOUNTER — Other Ambulatory Visit: Payer: Self-pay | Admitting: Family Medicine

## 2021-03-15 DIAGNOSIS — F17210 Nicotine dependence, cigarettes, uncomplicated: Secondary | ICD-10-CM

## 2021-04-09 ENCOUNTER — Ambulatory Visit
Admission: RE | Admit: 2021-04-09 | Discharge: 2021-04-09 | Disposition: A | Discharge status: Home / Self Care | Payer: Medicare PPO | Source: Ambulatory Visit | Attending: Family Medicine | Admitting: Family Medicine

## 2021-04-09 DIAGNOSIS — F17210 Nicotine dependence, cigarettes, uncomplicated: Secondary | ICD-10-CM

## 2021-06-10 ENCOUNTER — Other Ambulatory Visit: Payer: Self-pay | Admitting: Family Medicine

## 2021-10-25 ENCOUNTER — Other Ambulatory Visit: Payer: Self-pay | Admitting: Family Medicine

## 2021-10-25 DIAGNOSIS — Z1231 Encounter for screening mammogram for malignant neoplasm of breast: Secondary | ICD-10-CM

## 2021-11-19 ENCOUNTER — Ambulatory Visit: Payer: Medicare PPO

## 2021-11-24 ENCOUNTER — Ambulatory Visit
Admission: RE | Admit: 2021-11-24 | Discharge: 2021-11-24 | Disposition: A | Discharge status: Home / Self Care | Payer: Medicare PPO | Source: Ambulatory Visit | Attending: Family Medicine | Admitting: Family Medicine

## 2021-11-24 DIAGNOSIS — Z1231 Encounter for screening mammogram for malignant neoplasm of breast: Secondary | ICD-10-CM

## 2022-02-24 ENCOUNTER — Other Ambulatory Visit: Payer: Self-pay | Admitting: Family Medicine

## 2022-02-24 DIAGNOSIS — B182 Chronic viral hepatitis C: Secondary | ICD-10-CM

## 2022-03-10 ENCOUNTER — Ambulatory Visit
Admission: RE | Admit: 2022-03-10 | Discharge: 2022-03-10 | Disposition: A | Discharge status: Home / Self Care | Payer: Medicare PPO | Source: Ambulatory Visit | Attending: Family Medicine | Admitting: Family Medicine

## 2022-03-10 DIAGNOSIS — B182 Chronic viral hepatitis C: Secondary | ICD-10-CM

## 2022-08-05 IMAGING — CT CT CHEST LUNG CANCER SCREENING LOW DOSE W/O CM
1 series · 15 of 32 positions shown, 19 images · non-contrast
Comparison: No priors.

CLINICAL DATA: 70-year-old female current smoker with 53 pack-year
history of smoking. Lung cancer screening examination.



[Series 2: ldct screening <30 bmi · axial · 0.77mm/px · z∈[-232,+33]mm · 15 of 61 slices shown, 19 images]
[im 5/61  mediastinal]
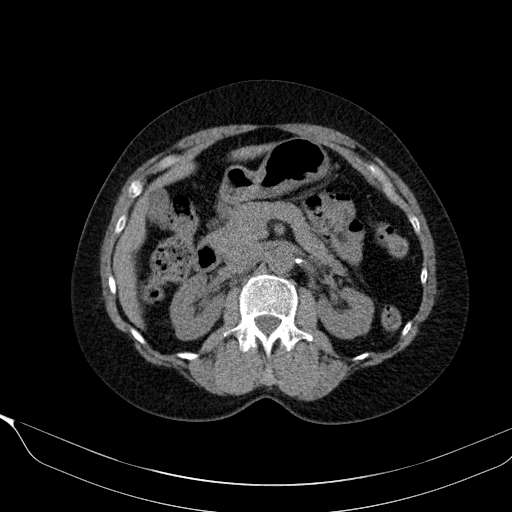
[im 5/61  lung]
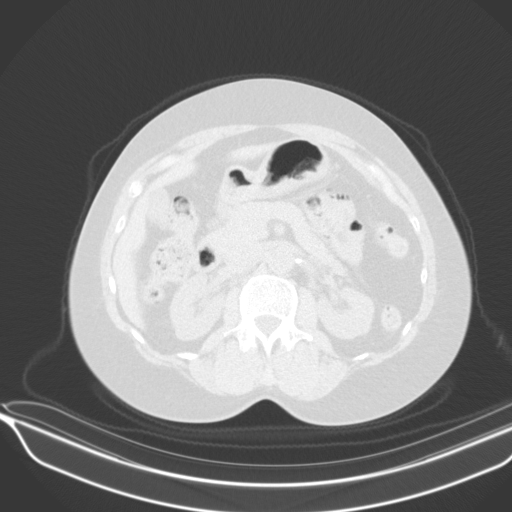
[im 9/61  lung]
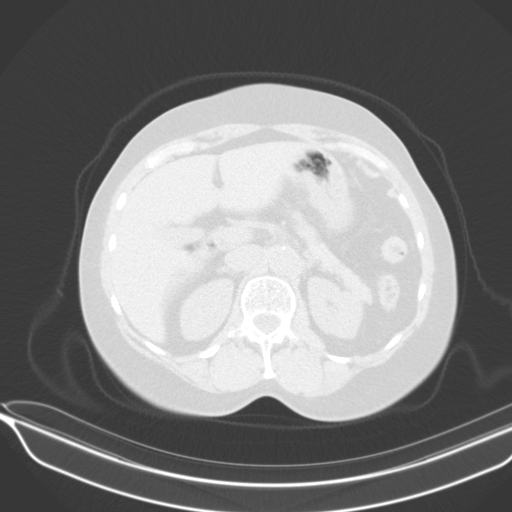
[im 13/61  lung]
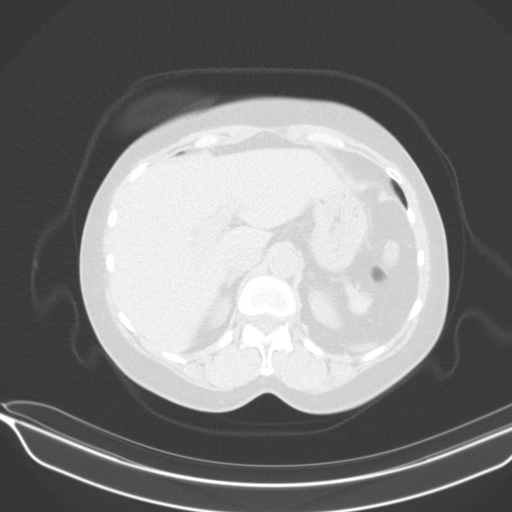
[im 16/61  lung]
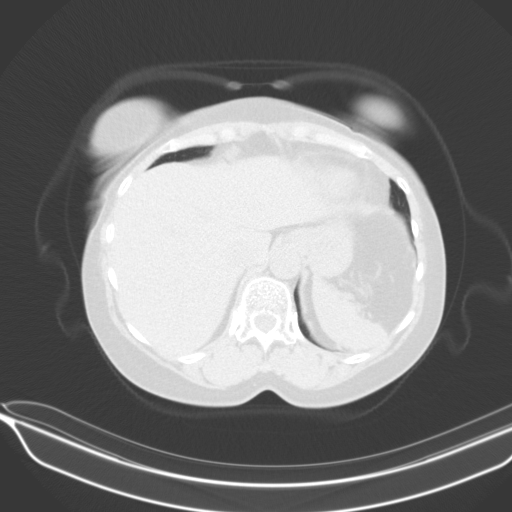
[im 21/61  mediastinal]
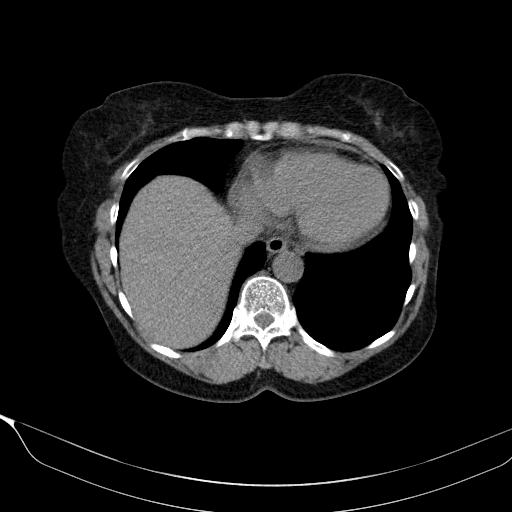
[im 21/61  lung]
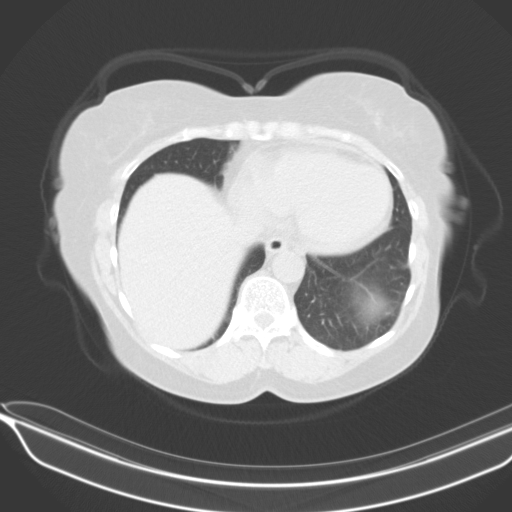
[im 25/61  lung]
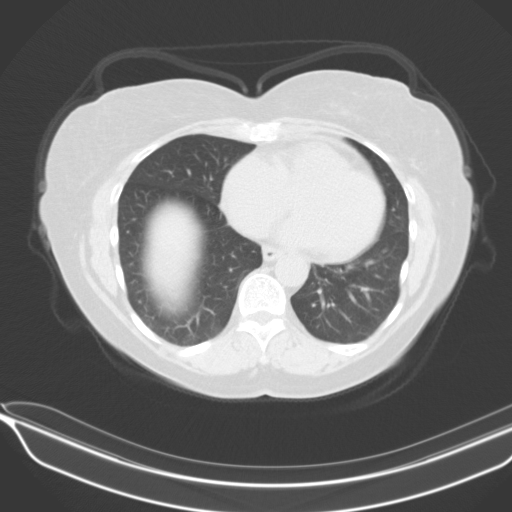
[im 29/61  lung]
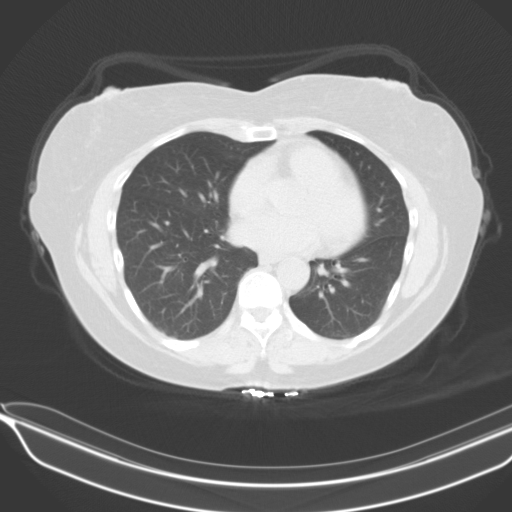
[im 32/61  lung]
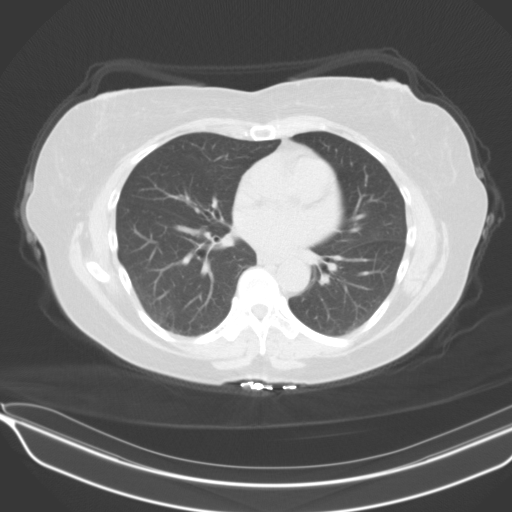
[im 34/61  mediastinal]
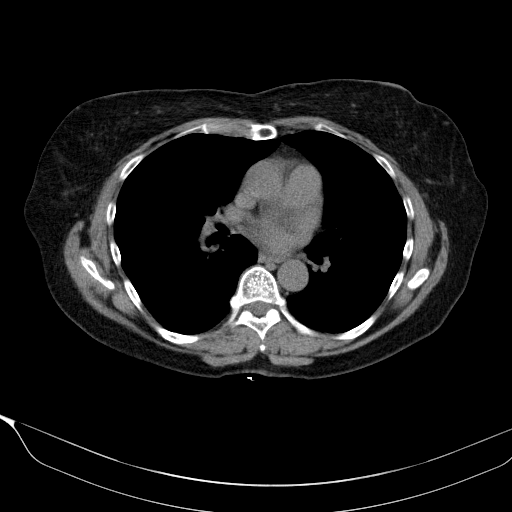
[im 34/61  lung]
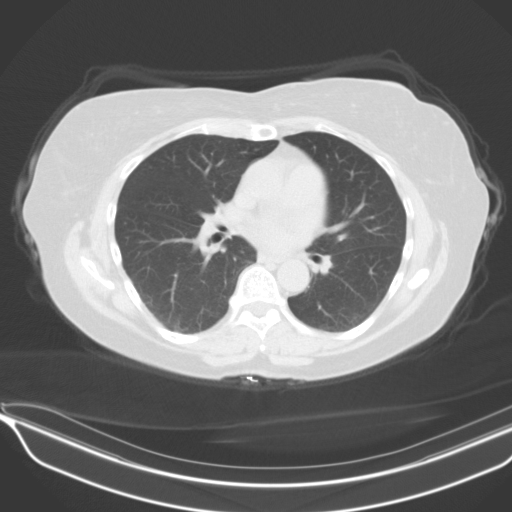
[im 37/61  lung]
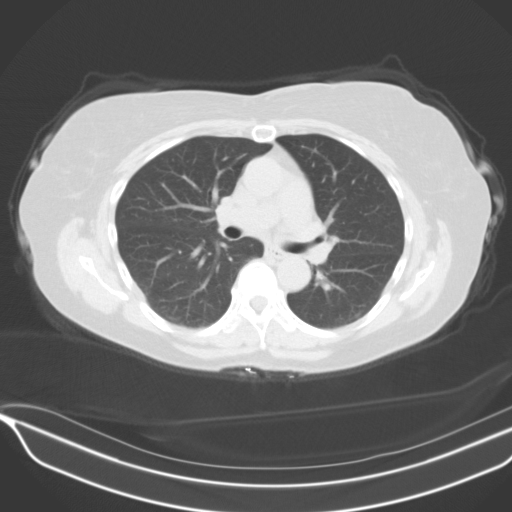
[im 41/61  lung]
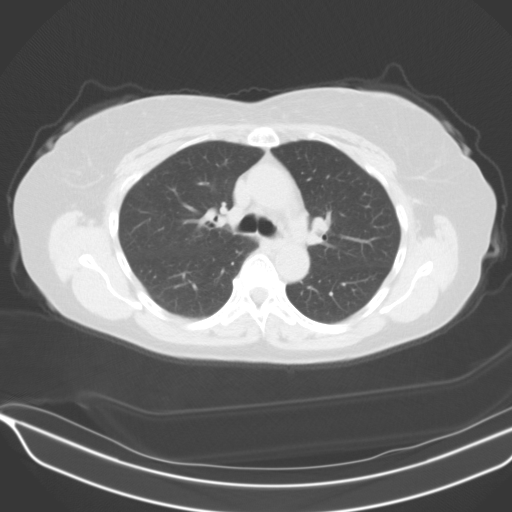
[im 45/61  lung]
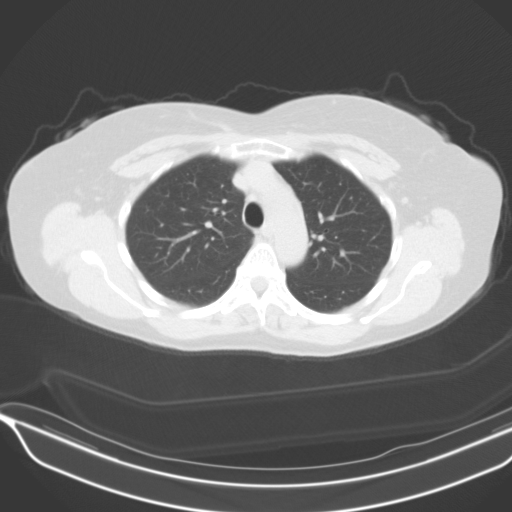
[im 49/61  mediastinal]
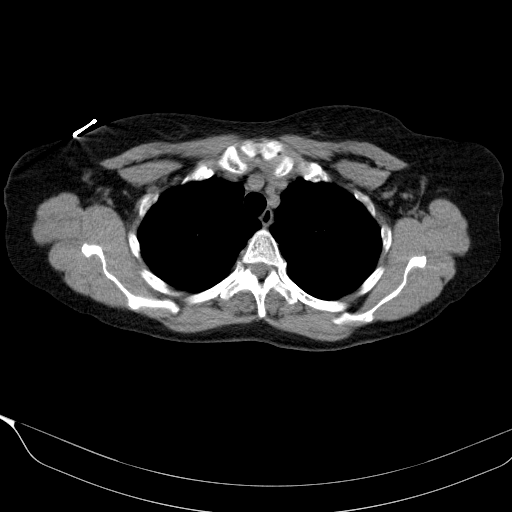
[im 49/61  lung]
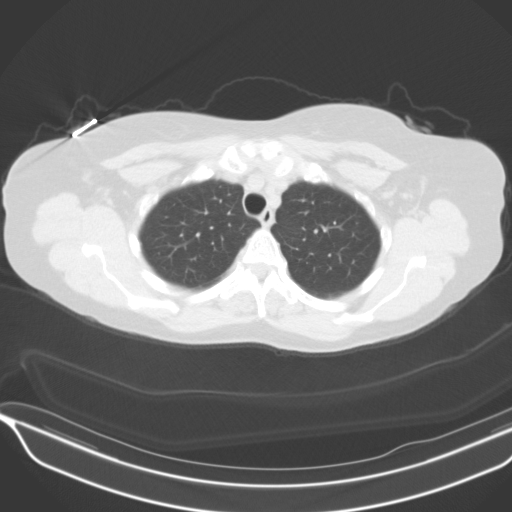
[im 54/61  lung]
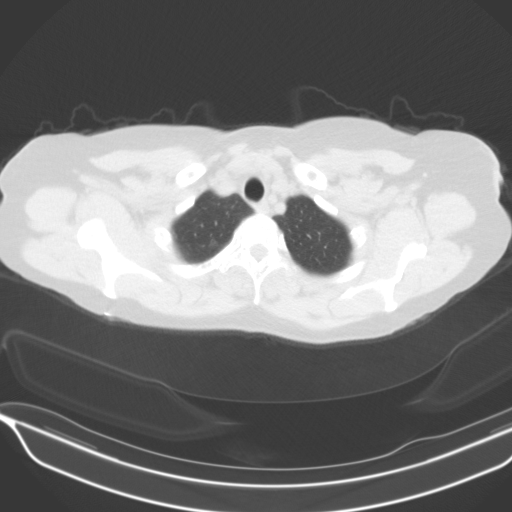
[im 58/61  lung]
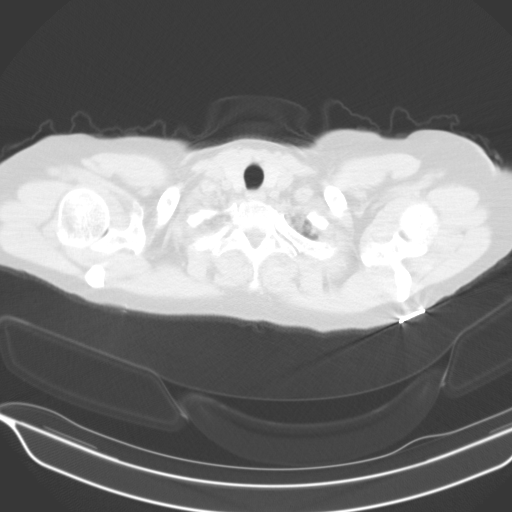

[15 of 32 positions shown; findings below may reference images not displayed]

FINDINGS: Cardiovascular: Heart size is normal. There is no significant
pericardial fluid, thickening or pericardial calcification. Aortic
atherosclerosis. No definite coronary artery calcifications.

Mediastinum/Nodes: No pathologically enlarged mediastinal or hilar
lymph nodes. Esophagus is unremarkable in appearance. No axillary
lymphadenopathy.

Lungs/Pleura: No suspicious appearing pulmonary nodules or masses
are noted. No acute consolidative airspace disease. No pleural
effusions.

Upper Abdomen: Aortic atherosclerosis.

Musculoskeletal: There are no aggressive appearing lytic or blastic
lesions noted in the visualized portions of the skeleton.
IMPRESSION: 1. Lung-RADS 1, negative. Continue annual screening with low-dose
chest CT without contrast in 12 months.
2. Aortic atherosclerosis.

Aortic Atherosclerosis ([id number]).

## 2022-09-06 ENCOUNTER — Other Ambulatory Visit: Payer: Self-pay | Admitting: Family Medicine

## 2022-09-06 ENCOUNTER — Ambulatory Visit
Admission: RE | Admit: 2022-09-06 | Discharge: 2022-09-06 | Disposition: A | Discharge status: Home / Self Care | Payer: Medicare PPO | Source: Ambulatory Visit | Attending: Family Medicine | Admitting: Family Medicine

## 2022-09-06 DIAGNOSIS — R0789 Other chest pain: Secondary | ICD-10-CM

## 2022-10-25 ENCOUNTER — Other Ambulatory Visit: Payer: Self-pay | Admitting: Family Medicine

## 2022-10-25 DIAGNOSIS — Z1231 Encounter for screening mammogram for malignant neoplasm of breast: Secondary | ICD-10-CM

## 2022-11-29 ENCOUNTER — Ambulatory Visit: Payer: Medicare PPO

## 2022-11-29 ENCOUNTER — Ambulatory Visit
Admission: RE | Admit: 2022-11-29 | Discharge: 2022-11-29 | Disposition: A | Discharge status: Home / Self Care | Payer: Medicare PPO | Source: Ambulatory Visit | Attending: Family Medicine | Admitting: Family Medicine

## 2022-11-29 DIAGNOSIS — Z1231 Encounter for screening mammogram for malignant neoplasm of breast: Secondary | ICD-10-CM

## 2023-06-16 ENCOUNTER — Other Ambulatory Visit: Payer: Self-pay | Admitting: Family Medicine

## 2023-06-16 DIAGNOSIS — Z122 Encounter for screening for malignant neoplasm of respiratory organs: Secondary | ICD-10-CM

## 2023-06-16 DIAGNOSIS — F17200 Nicotine dependence, unspecified, uncomplicated: Secondary | ICD-10-CM

## 2023-06-16 DIAGNOSIS — Z1231 Encounter for screening mammogram for malignant neoplasm of breast: Secondary | ICD-10-CM

## 2023-07-07 ENCOUNTER — Encounter: Payer: Self-pay | Admitting: Family Medicine

## 2023-07-11 ENCOUNTER — Ambulatory Visit
Admission: RE | Admit: 2023-07-11 | Discharge: 2023-07-11 | Disposition: A | Discharge status: Home / Self Care | Source: Ambulatory Visit | Attending: Family Medicine | Admitting: Family Medicine

## 2023-07-11 DIAGNOSIS — Z122 Encounter for screening for malignant neoplasm of respiratory organs: Secondary | ICD-10-CM

## 2023-07-11 DIAGNOSIS — F17200 Nicotine dependence, unspecified, uncomplicated: Secondary | ICD-10-CM

## 2023-11-30 ENCOUNTER — Ambulatory Visit
Admission: RE | Admit: 2023-11-30 | Discharge: 2023-11-30 | Disposition: A | Discharge status: Home / Self Care | Source: Ambulatory Visit | Attending: Family Medicine

## 2023-11-30 DIAGNOSIS — Z1231 Encounter for screening mammogram for malignant neoplasm of breast: Secondary | ICD-10-CM

## 2024-03-26 ENCOUNTER — Ambulatory Visit: Admitting: Physician Assistant

## 2024-03-26 ENCOUNTER — Encounter: Payer: Self-pay | Admitting: Physician Assistant

## 2024-03-26 VITALS — BP 146/96 | HR 64

## 2024-03-26 DIAGNOSIS — Z09 Encounter for follow-up examination after completed treatment for conditions other than malignant neoplasm: Secondary | ICD-10-CM | POA: Diagnosis not present

## 2024-03-26 DIAGNOSIS — Z872 Personal history of diseases of the skin and subcutaneous tissue: Secondary | ICD-10-CM | POA: Diagnosis not present

## 2024-03-26 DIAGNOSIS — R21 Rash and other nonspecific skin eruption: Secondary | ICD-10-CM

## 2024-03-26 NOTE — Patient Instructions (Signed)

## 2024-03-26 NOTE — Progress Notes (Signed)
" ° °  New Patient Visit   Subjective  Christie Velasquez is a 73 y.o. female NEW PATIENT who presents for the following: Rash  Patient states she has rash located at the neck and chest that she would like to have examined. Patient reports the areas have been there for 10 years. She reports the areas are bothersome.Patient rates irritation 5 out of 10. She states that the areas have spread from he neck to her chest. Patient reports she has not previously been treated for these areas. Patient denies Hx of bx. Patient states she takes xyzal and a 24 hour OTC allergy medication and she states this helps and it will go away but comes back every 3 months. Has photos on her iPhone.     The following portions of the chart were reviewed this encounter and updated as appropriate: medications, allergies, medical history  Review of Systems:  No other skin or systemic complaints except as noted in HPI or Assessment and Plan.  Objective  Well appearing patient in no apparent distress; mood and affect are within normal limits.   A focused examination was performed of the following areas: Chest and neck   Relevant exam findings are noted in the Assessment and Plan.    Assessment & Plan   Rash - Neck and chest Exam: Resolved today   Differential diagnosis:  - could be urticaria but hard to tell from her pictures on her iPhone.    Treatment Plan: 10 mg daily zyrtec  RASH AND OTHER NONSPECIFIC SKIN ERUPTION    Return if symptoms worsen or fail to improve.  I, Doyce Pan, CMA, am acting as scribe for Kevonna Nolte K, PA-C.   Documentation: I have reviewed the above documentation for accuracy and completeness, and I agree with the above.  Reginald Weida K, PA-C     "
